# Patient Record
Sex: Male | Born: 2000 | Race: White | Hispanic: No | Marital: Single | Smoking: Never smoker
Health system: Southern US, Community
[De-identification: ages and names within clinical notes are randomized; demographics above are authoritative.]

## PROBLEM LIST (undated history)

## (undated) DIAGNOSIS — K5900 Constipation, unspecified: Secondary | ICD-10-CM

## (undated) DIAGNOSIS — L709 Acne, unspecified: Secondary | ICD-10-CM

## (undated) DIAGNOSIS — J45909 Unspecified asthma, uncomplicated: Secondary | ICD-10-CM

## (undated) DIAGNOSIS — F32A Depression, unspecified: Secondary | ICD-10-CM

## (undated) DIAGNOSIS — F329 Major depressive disorder, single episode, unspecified: Secondary | ICD-10-CM

## (undated) HISTORY — DX: Acne, unspecified: L70.9

## (undated) HISTORY — PX: TONSILLECTOMY: SUR1361

## (undated) HISTORY — PX: TYMPANOSTOMY TUBE PLACEMENT: SHX32

---
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
## 2022-03-08 ENCOUNTER — Emergency Department: Payer: Medicaid Other

## 2022-03-08 ENCOUNTER — Encounter: Payer: Self-pay | Admitting: Emergency Medicine

## 2022-03-08 ENCOUNTER — Emergency Department
Admission: EM | Admit: 2022-03-08 | Discharge: 2022-03-08 | Disposition: A | Discharge status: Home / Self Care | Payer: Medicaid Other | Attending: Emergency Medicine | Admitting: Emergency Medicine

## 2022-03-08 ENCOUNTER — Other Ambulatory Visit: Payer: Self-pay

## 2022-03-08 DIAGNOSIS — N5082 Scrotal pain: Secondary | ICD-10-CM | POA: Diagnosis present

## 2022-03-08 DIAGNOSIS — N492 Inflammatory disorders of scrotum: Secondary | ICD-10-CM | POA: Diagnosis not present

## 2022-03-08 LAB — URINALYSIS, ROUTINE W REFLEX MICROSCOPIC
Bilirubin Urine: NEGATIVE
Glucose, UA: NEGATIVE mg/dL
Hgb urine dipstick: NEGATIVE
Ketones, ur: NEGATIVE mg/dL
Leukocytes,Ua: NEGATIVE
Nitrite: NEGATIVE
Protein, ur: NEGATIVE mg/dL
Specific Gravity, Urine: 1.028 (ref 1.005–1.030)
pH: 5 (ref 5.0–8.0)

## 2022-03-08 LAB — BLOOD CULTURE ID PANEL (REFLEXED) - BCID2

## 2022-03-08 LAB — CBC WITH DIFFERENTIAL/PLATELET
Abs Immature Granulocytes: 0.02 10*3/uL (ref 0.00–0.07)
Basophils Absolute: 0 10*3/uL (ref 0.0–0.1)
Basophils Relative: 1 %
Eosinophils Absolute: 0.2 10*3/uL (ref 0.0–0.5)
Eosinophils Relative: 2 %
HCT: 43.5 % (ref 39.0–52.0)
Hemoglobin: 14.4 g/dL (ref 13.0–17.0)
Immature Granulocytes: 0 %
Lymphocytes Relative: 30 %
Lymphs Abs: 2.5 10*3/uL (ref 0.7–4.0)
MCH: 27.4 pg (ref 26.0–34.0)
MCHC: 33.1 g/dL (ref 30.0–36.0)
MCV: 82.7 fL (ref 80.0–100.0)
Monocytes Absolute: 0.6 10*3/uL (ref 0.1–1.0)
Monocytes Relative: 8 %
Neutro Abs: 5 10*3/uL (ref 1.7–7.7)
Neutrophils Relative %: 59 %
Platelets: 247 10*3/uL (ref 150–400)
RBC: 5.26 MIL/uL (ref 4.22–5.81)
RDW: 12.6 % (ref 11.5–15.5)
WBC: 8.3 10*3/uL (ref 4.0–10.5)
nRBC: 0 % (ref 0.0–0.2)

## 2022-03-08 LAB — BASIC METABOLIC PANEL
Anion gap: 11 (ref 5–15)
BUN: 17 mg/dL (ref 6–20)
CO2: 24 mmol/L (ref 22–32)
Calcium: 9.8 mg/dL (ref 8.9–10.3)
Chloride: 101 mmol/L (ref 98–111)
Creatinine, Ser: 0.96 mg/dL (ref 0.61–1.24)
GFR, Estimated: >60 mL/min (ref >60)
Glucose, Bld: 99 mg/dL (ref 70–99)
Potassium: 3.6 mmol/L (ref 3.5–5.1)
Sodium: 136 mmol/L (ref 135–145)

## 2022-03-08 LAB — CHLAMYDIA/NGC RT PCR (ARMC ONLY)
Chlamydia Tr: NOT DETECTED
N gonorrhoeae: NOT DETECTED

## 2022-03-08 LAB — LACTIC ACID, PLASMA: Lactic Acid, Venous: 0.7 mmol/L (ref 0.5–1.9)

## 2022-03-08 LAB — HIV ANTIBODY (ROUTINE TESTING W REFLEX): HIV Screen 4th Generation wRfx: NONREACTIVE

## 2022-03-08 LAB — PROCALCITONIN: Procalcitonin: <0.1 ng/mL

## 2022-03-08 MED ORDER — CEPHALEXIN 500 MG PO CAPS: 500.0000 mg | ORAL_CAPSULE | Freq: Three times a day (TID) | ORAL | 0 refills | Status: AC

## 2022-03-08 MED ORDER — LACTATED RINGERS IV BOLUS
1000.0000 mL | Freq: Once | INTRAVENOUS | Status: AC
  Administered 2022-03-08: 1000 mL via INTRAVENOUS

## 2022-03-08 MED ORDER — ACETAMINOPHEN 500 MG PO TABS
1000.0000 mg | ORAL_TABLET | Freq: Once | ORAL | Status: AC
  Administered 2022-03-08: 1000 mg via ORAL
  Filled 2022-03-08: qty 2

## 2022-03-08 MED ORDER — OXYCODONE HCL 5 MG PO TABS
5.0000 mg | ORAL_TABLET | Freq: Once | ORAL | Status: AC
  Administered 2022-03-08: 5 mg via ORAL
  Filled 2022-03-08: qty 1

## 2022-03-08 MED ORDER — OXYCODONE HCL 5 MG PO TABS: 5.0000 mg | ORAL_TABLET | Freq: Four times a day (QID) | ORAL | 0 refills | Status: DC | PRN

## 2022-03-08 MED ORDER — IOHEXOL 350 MG/ML SOLN
100.0000 mL | Freq: Once | INTRAVENOUS | Status: AC | PRN
  Administered 2022-03-08: 100 mL via INTRAVENOUS

## 2022-03-08 MED ORDER — CEPHALEXIN 500 MG PO CAPS
500.0000 mg | ORAL_CAPSULE | Freq: Once | ORAL | Status: AC
  Administered 2022-03-08: 500 mg via ORAL
  Filled 2022-03-08: qty 1

## 2022-03-08 MED ORDER — KETOROLAC TROMETHAMINE 30 MG/ML IJ SOLN
15.0000 mg | Freq: Once | INTRAMUSCULAR | Status: AC
  Administered 2022-03-08: 15 mg via INTRAVENOUS
  Filled 2022-03-08: qty 1

## 2022-03-08 NOTE — ED Notes (Signed)
Discharge instructions explained to patient and family at this time. Patient and family state they understand and agree.

## 2022-03-08 NOTE — Discharge Instructions (Signed)
Please take Tylenol and ibuprofen/Advil for your pain.  It is safe to take them together, or to alternate them every few hours.  Take up to 1032m of Tylenol at a time, up to 4 times per day.  Do not take more than 4000 mg of Tylenol in 24 hours.  For ibuprofen, take 400-600 mg, 3 - 4 times per day.  You are being discharged with a prescription for Keflex antibiotic to take 3 times daily for the next 5 days to treat cellulitis/skin infection  Oxycodone pain medicine to use as needed for more severe/breakthrough pain.  Do not use while driving or operating machinery.  Please reach out to the clinic of Dr. BErlene Quan a local urologist, to be seen in the clinic

## 2022-03-08 NOTE — ED Provider Notes (Signed)
[facility] Provider Note    Event Date/Time   First MD Initiated Contact with Patient 03/08/22 [phone]     (approximate)   History   Testicle Pain   HPI  Ryan Odom is a 22 y.o. male who presents to the ED for evaluation of Testicle Pain   Review of medical DC summary from 11/21.  Obese patient with history of PTSD, WPW.   Patient presents to the ED for evaluation of 2 to 3 days of penile and scrotal pain and swelling.  This has never happened before.  No trauma.  No sexual intercourse of any kind for 80 months, male or male.  Denies masturbation.  He reports symptoms started 2 to 3 days ago with scaling and dry skin, increasingly red and painful.  Reports systemic symptoms of feeling "unwell" earlier today.  No documented fevers.  He is uncertain about urethral discharge when he is not voiding, but he thought there was "some green stuff" at some point.  He reports his urine is "split like a snake tongue" when he pees in the past day.  Reports discomfort around the penis when he is voiding.  Also reports some left flank discomfort.  Denies stool changes or pain with defecation   Physical Exam   Triage Vital Signs: ED Triage Vitals  Enc Vitals Group     BP 03/08/22 0050 (!) 131/93     Pulse Rate 03/08/22 0050 84     Resp 03/08/22 0050 18     Temp 03/08/22 0050 98.1 F (36.7 C)     Temp Source 03/08/22 0050 Oral     SpO2 03/08/22 0050 98 %     Weight 03/08/22 0048 235 lb (106.6 kg)     Height 03/08/22 0048 5' 9"$  (1.753 m)     Head Circumference --      Peak Flow --      Pain Score 03/08/22 0047 7     Pain Loc --      Pain Edu? --      Excl. in [place]? --     Most recent vital signs: Vitals:   03/08/22 0050  BP: (!) 131/93  Pulse: 84  Resp: 18  Temp: 98.1 F (36.7 C)  SpO2: 98%    General: Awake, no distress.  Obese, ambulatory and looks systemically well. CV:  Good peripheral perfusion.  Resp:  Normal effort.  Abd:  No distention.   Soft and benign throughout.  No pubic, suprapubic or inguinal tenderness or skin changes.  No masses. GU:   Exam chaperoned by nursing staff, Liane Comber.  Circumcised penis and scrotum are diffusely erythematous without discrete dermatologic features beyond erythema/erythroderma.  No raised features.  No noted urethral discharge.  Tender throughout.  Bilateral testicles are descended, smooth, round and symmetric.  MSK:  No deformity noted.  Neuro:  No focal deficits appreciated. Other:       ED Results / Procedures / Treatments   Labs (all labs ordered are listed, but only abnormal results are displayed) Labs Reviewed  URINALYSIS, ROUTINE W REFLEX MICROSCOPIC - Abnormal; Notable for the following components:      Result Value   Color, Urine YELLOW (*)    APPearance CLEAR (*)    All other components within normal limits  CHLAMYDIA/NGC RT PCR ([facility] ONLY)            URINE CULTURE  CULTURE, BLOOD (SINGLE)  CBC WITH DIFFERENTIAL/PLATELET  BASIC METABOLIC PANEL  PROCALCITONIN  LACTIC ACID, PLASMA  LACTIC ACID, PLASMA  HIV ANTIBODY (ROUTINE TESTING W REFLEX)    EKG   RADIOLOGY CT abdomen/pelvis interpreted by me without evidence of acute intra-abdominal pathology.   Scrotal ultrasound interpreted by me without signs of abscess or clear pathology  Official radiology report(s): US SCROTUM W/DOPPLER  Result Date: 03/08/2022 CLINICAL DATA:  Scrotal pain and discoloration. EXAM: SCROTAL ULTRASOUND DOPPLER ULTRASOUND OF THE TESTICLES TECHNIQUE: Complete ultrasound examination of the testicles, epididymis, and other scrotal structures was performed. Color and spectral Doppler ultrasound were also utilized to evaluate blood flow to the testicles. COMPARISON:  Testicular ultrasound dated 05/22/2020. FINDINGS: Right testicle Measurements: 4.4 x 1.8 x 1.8 cm. No mass or microlithiasis visualized. Left testicle Measurements: 4.3 x 1.9 x 3.4 cm. No mass or microlithiasis visualized. Right  epididymis:  Normal in size and appearance. Left epididymis:  Normal in size and appearance. Hydrocele:  None visualized. Varicocele:  None visualized. Pulsed Doppler interrogation of both testes demonstrates normal low resistance arterial and venous waveforms bilaterally. IMPRESSION: Unremarkable testicular ultrasound. Electronically Signed   By: Anner Crete M.D.   On: 03/08/2022 03:20   CT ABDOMEN PELVIS W CONTRAST  Result Date: 03/08/2022 CLINICAL DATA:  Pelvic cellulitis EXAM: CT ABDOMEN AND PELVIS WITH CONTRAST TECHNIQUE: Multidetector CT imaging of the abdomen and pelvis was performed using the standard protocol following bolus administration of intravenous contrast. RADIATION DOSE REDUCTION: This exam was performed according to the departmental dose-optimization program which includes automated exposure control, adjustment of the mA and/or kV according to patient size and/or use of iterative reconstruction technique. CONTRAST:  146m OMNIPAQUE IOHEXOL 350 MG/ML SOLN COMPARISON:  12/06/2021 FINDINGS: Lower chest: Lung bases are clear. Hepatobiliary: No focal liver abnormality is seen. No gallstones, gallbladder wall thickening, or biliary dilatation. Pancreas: Normal Spleen: Normal Adrenals/Urinary Tract: Adrenal glands are normal. Both kidneys are normal. There is no hydroureteronephrosis. Urinary bladder is normal. Stomach/Bowel: Stomach is within normal limits. Appendix appears normal. No evidence of bowel wall thickening, distention, or inflammatory changes. Vascular/Lymphatic: No significant vascular findings are present. No enlarged abdominal or pelvic lymph nodes. Reproductive: The prostate is normal. There is limited visualization of the penis and scrotum. No abnormality within the visualized pelvic soft tissues. Other: None Musculoskeletal: No acute or significant osseous findings. IMPRESSION: 1. No acute abnormality of the abdomen or pelvis. 2. Limited visualization of the penis and scrotum.  No abnormality within the visualized pelvic soft tissues. Electronically Signed   By: KUlyses JarredM.D.   On: 03/08/2022 02:52    PROCEDURES and INTERVENTIONS:  Procedures  Medications  cephALEXin (KEFLEX) capsule 500 mg (has no administration in time range)  acetaminophen (TYLENOL) tablet 1,000 mg (has no administration in time range)  oxyCODONE (Oxy IR/ROXICODONE) immediate release tablet 5 mg (has no administration in time range)  ketorolac (TORADOL) 30 MG/ML injection 15 mg (15 mg Intravenous Given 03/08/22 0137)  lactated ringers bolus 1,000 mL (0 mLs Intravenous Stopped 03/08/22 0313)  iohexol (OMNIPAQUE) 350 MG/ML injection 100 mL (100 mLs Intravenous Contrast Given 03/08/22 0214)     IMPRESSION / MDM / ASSESSMENT AND PLAN / ED COURSE  I reviewed the triage vital signs and the nursing notes.  Differential diagnosis includes, but is not limited to, cellulitis, sepsis, abscess, gonorrhea, chlamydia, pyelonephritis,   {Patient presents with symptoms of an acute illness or injury that is potentially life-threatening.  22year old presents to the ED with testicular and scrotal pain, possibly due to scrotal cellulitis and suitable for outpatient management with  close urology follow-up.  Normal vital signs without fever or signs of sepsis.  Similarly reassuring blood work with normal CBC, metabolic panel, lactic acid, procalcitonin.  Urine without signs of UTI or gonorrhea/chlamydia.  Normal scrotal ultrasound and CT.  On exam, he has a diffusely erythematous and tender penis and scrotum.  Is consistent with cellulitis, doubt Fournier's gangrene.  His pain is controlled and he was discharged with Keflex to follow-up with urology  Clinical Course as of 03/08/22 0422  Tue Mar 08, 2022  0356 Reassessed and discussed workup so far.  Got some additional history that was not particularly helpful.  We discussed likely cellulitis but some degree of uncertainty.  We discussed following up with  urology closely as we start a course of antibiotics to treat as such. [DS]    Clinical Course User Index [DS] Vladimir Crofts, MD     FINAL CLINICAL IMPRESSION(S) / ED DIAGNOSES   Final diagnoses:  Cellulitis of scrotum  Scrotal pain     Rx / DC Orders   ED Discharge Orders          Ordered    cephALEXin (KEFLEX) 500 MG capsule  3 times daily        03/08/22 0358    oxyCODONE (ROXICODONE) 5 MG immediate release tablet  Every 6 hours PRN        03/08/22 0358             Note:  This document was prepared using Dragon voice recognition software and may include unintentional dictation errors.   Vladimir Crofts, MD 03/08/22 [phone]

## 2022-03-08 NOTE — ED Triage Notes (Signed)
Patient ambulatory to triage with steady gait, without difficulty or distress noted; pt reports last few days having pain & discoloration to penis & scrotum with no known injury

## 2022-03-09 LAB — URINE CULTURE: Culture: <10000 — AB

## 2022-03-10 LAB — CULTURE, BLOOD (SINGLE): Special Requests: ADEQUATE

## 2022-03-22 ENCOUNTER — Encounter (INDEPENDENT_AMBULATORY_CARE_PROVIDER_SITE_OTHER): Payer: Self-pay

## 2022-05-09 ENCOUNTER — Other Ambulatory Visit: Payer: Self-pay

## 2022-05-09 ENCOUNTER — Emergency Department
Admission: EM | Admit: 2022-05-09 | Discharge: 2022-05-09 | Disposition: A | Discharge status: Home / Self Care | Payer: Medicaid Other | Attending: Emergency Medicine | Admitting: Emergency Medicine

## 2022-05-09 DIAGNOSIS — W891XXA Exposure to tanning bed, initial encounter: Secondary | ICD-10-CM | POA: Diagnosis not present

## 2022-05-09 DIAGNOSIS — L568 Other specified acute skin changes due to ultraviolet radiation: Secondary | ICD-10-CM | POA: Diagnosis present

## 2022-05-09 MED ORDER — OXYCODONE HCL 5 MG PO TABS
5.0000 mg | ORAL_TABLET | Freq: Once | ORAL | Status: AC
  Administered 2022-05-09: 5 mg via ORAL
  Filled 2022-05-09: qty 1

## 2022-05-09 MED ORDER — KETOROLAC TROMETHAMINE 30 MG/ML IJ SOLN
30.0000 mg | Freq: Once | INTRAMUSCULAR | Status: AC
  Administered 2022-05-09: 30 mg via INTRAMUSCULAR
  Filled 2022-05-09: qty 1

## 2022-05-09 NOTE — Discharge Instructions (Signed)
Please take Tylenol and ibuprofen/Advil for your pain.  It is safe to take them together, or to alternate them every few hours.  Take up to 1000mg of Tylenol at a time, up to 4 times per day.  Do not take more than 4000 mg of Tylenol in 24 hours.  For ibuprofen, take 400-600 mg, 3 - 4 times per day.  

## 2022-05-09 NOTE — ED Triage Notes (Signed)
Pt sts he was in a tanning bed and sts his normal timer is 7 mins and the employees put the timer for 15 and there was no way for him to contact them that it was too long. Pt has redness all over his torso and his back. No obvious blisters noted. Sts this happened 3 days ago.

## 2022-05-09 NOTE — ED Provider Notes (Signed)
   [facility] Provider Note    Event Date/Time   First MD Initiated Contact with Patient 05/09/22 0234     (approximate)   History   Sunburn   HPI  MICHAL CALLICOTT is a 22 y.o. male who presents to the ED for evaluation of Sunburn   Patient presents to the ED with a sunburn.  He reports going to a tanning booth 3 days ago and is at the time for too long and he had a burn since then.  Reports difficulty sleeping.  He is asking if we can fix it or give him some cream to make it go away   Physical Exam   Triage Vital Signs: ED Triage Vitals  Enc Vitals Group     BP 05/09/22 0144 132/74     Pulse Rate 05/09/22 0144 72     Resp 05/09/22 0144 19     Temp 05/09/22 0144 98.5 F (36.9 C)     Temp Source 05/09/22 0144 Oral     SpO2 05/09/22 0144 98 %     Weight --      Height --      Head Circumference --      Peak Flow --      Pain Score 05/09/22 0155 9     Pain Loc --      Pain Edu? --      Excl. in GC? --     Most recent vital signs: Vitals:   05/09/22 0144  BP: 132/74  Pulse: 72  Resp: 19  Temp: 98.5 F (36.9 C)  SpO2: 98%    General: Awake, no distress.  Moist mucous membranes CV:  Good peripheral perfusion.  Resp:  Normal effort.  Abd:  No distention.  MSK:  No deformity noted.  Neuro:  No focal deficits appreciated. Other:  Superficial burn to the trunk without blistering or any evidence of partial-thickness or deeper burns.   ED Results / Procedures / Treatments   Labs (all labs ordered are listed, but only abnormal results are displayed) Labs Reviewed - No data to display  EKG   RADIOLOGY   Official radiology report(s): No results found.  PROCEDURES and INTERVENTIONS:  Procedures  Medications  ketorolac (TORADOL) 30 MG/ML injection 30 mg (30 mg Intramuscular Given 05/09/22 0242)  oxyCODONE (Oxy IR/ROXICODONE) immediate release tablet 5 mg (5 mg Oral Given 05/09/22 0243)     IMPRESSION / MDM / ASSESSMENT AND PLAN  / ED COURSE  I reviewed the triage vital signs and the nursing notes.  Differential diagnosis includes, but is not limited to, superficial burn, partial-thickness burn, cellulitis  Patient presents with superficial burn from a tanning booth suitable for outpatient management.  We discussed symptomatic measures and I see no indications for antibiotics, doubt cellulitis      FINAL CLINICAL IMPRESSION(S) / ED DIAGNOSES   Final diagnoses:  Sunburn due to tanning bed radiation     Rx / DC Orders   ED Discharge Orders     None        Note:  This document was prepared using Dragon voice recognition software and may include unintentional dictation errors.   Delton Prairie, MD 05/09/22 [phone]

## 2022-08-15 IMAGING — CT CT ANGIO CHEST
2 of 7 series · 18 of 46 positions shown · IV contrast (APPLIED)
Comparison: 02/03/2021 chest x-ray

CLINICAL DATA: Recently COVID positive last week, shortness of
breath with some chest pain

EXAM:
CT ANGIOGRAPHY CHEST WITH CONTRAST
TECHNIQUE: Multidetector CT imaging of the chest was performed using the
standard protocol during bolus administration of intravenous
contrast. Multiplanar CT image reconstructions and MIPs were
obtained to evaluate the vascular anatomy.

[Series 5: thins · axial · 0.67mm/px · z∈[-242,+20]mm · 15 of 365 slices shown]
[im 19/365  lung]
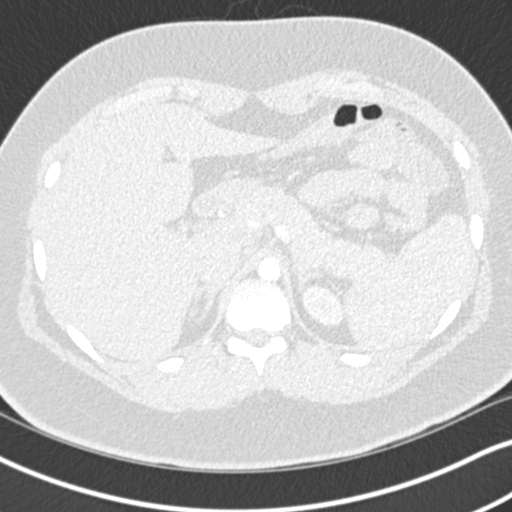
[im 37/365  soft-tissue]
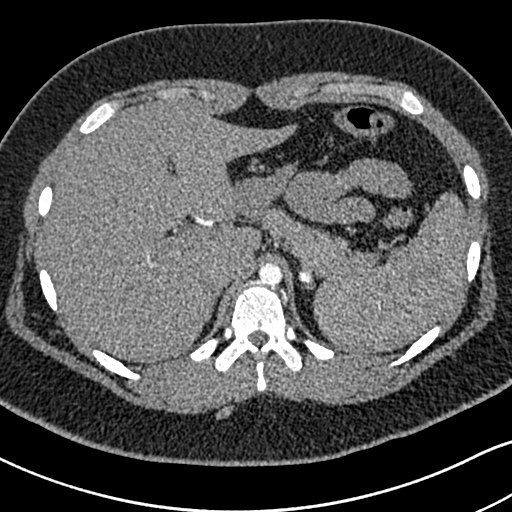
[im 73/365  lung]
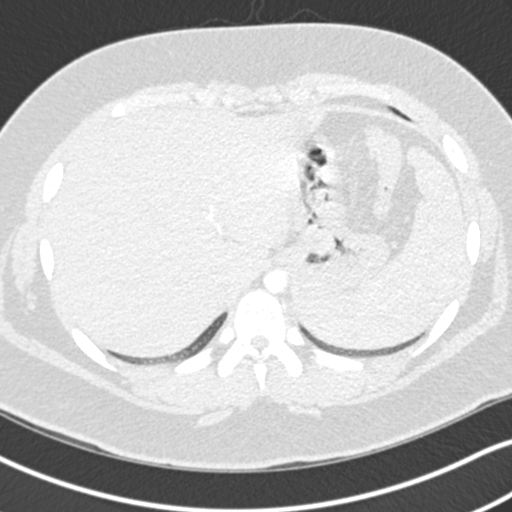
[im 92/365  soft-tissue]
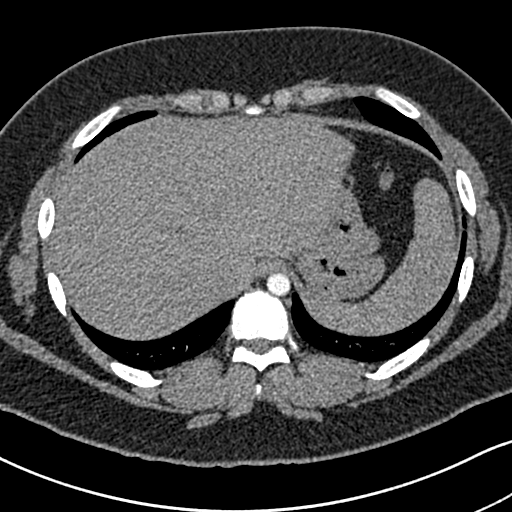
[im 110/365  lung]
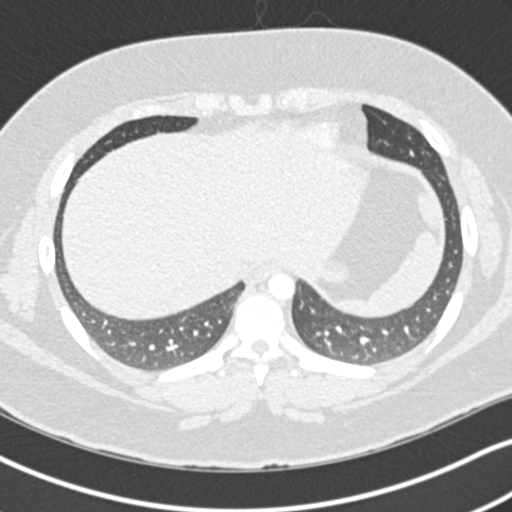
[im 128/365  soft-tissue]
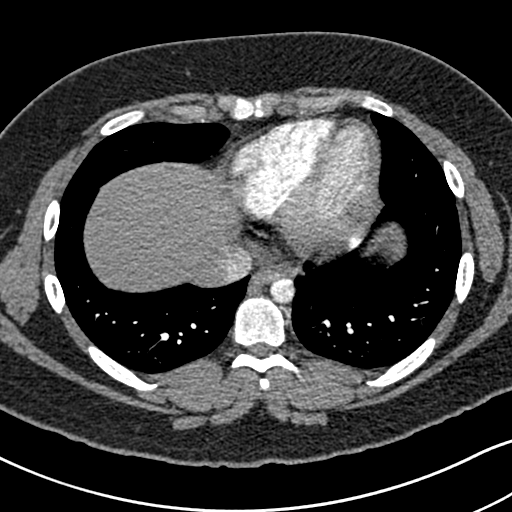
[im 164/365  lung]
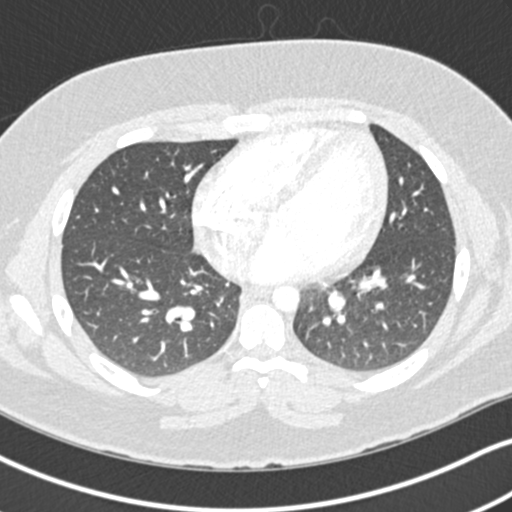
[im 183/365  soft-tissue]
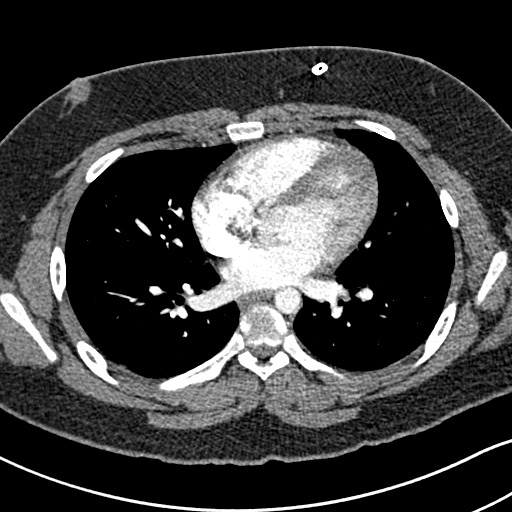
[im 201/365  lung]
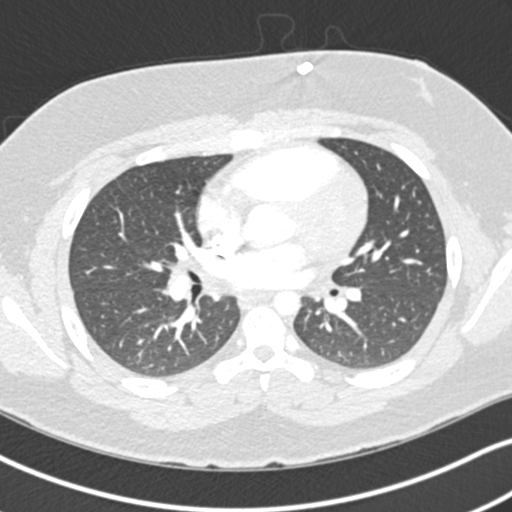
[im 237/365  soft-tissue]
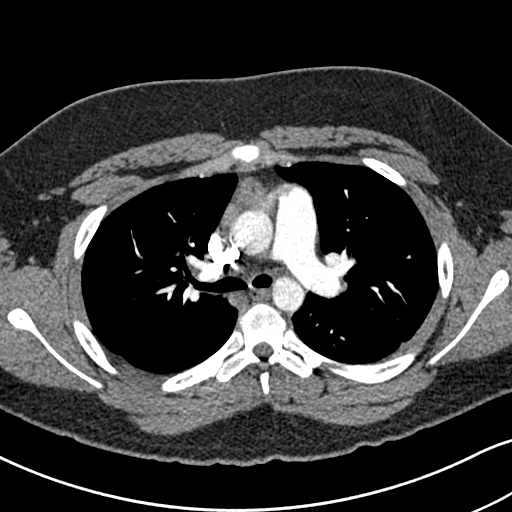
[im 255/365  lung]
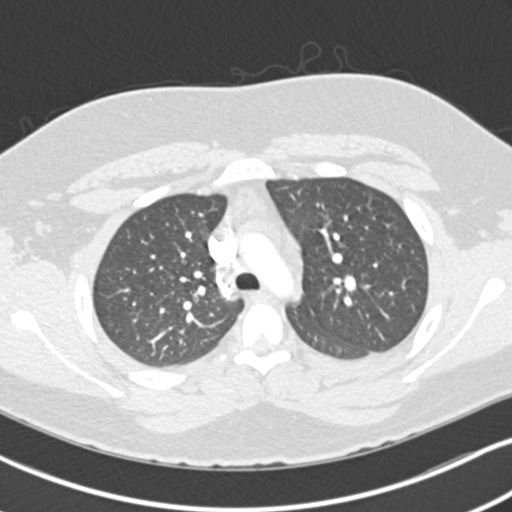
[im 274/365  soft-tissue]
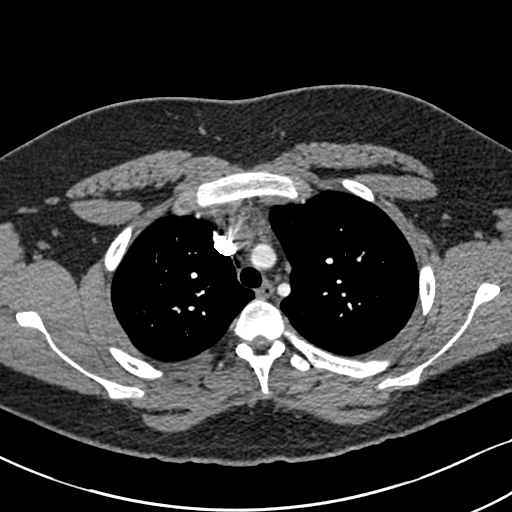
[im 292/365  lung]
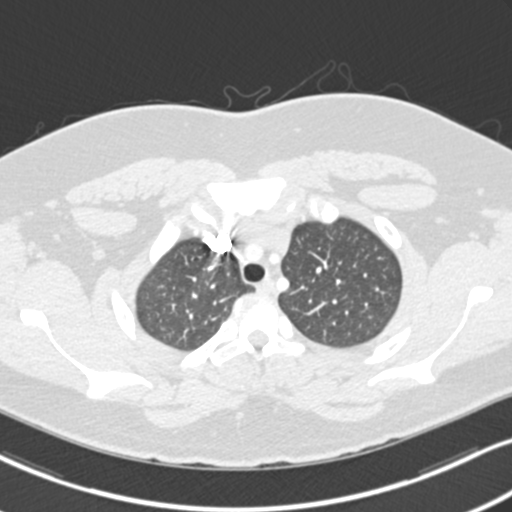
[im 328/365  soft-tissue]
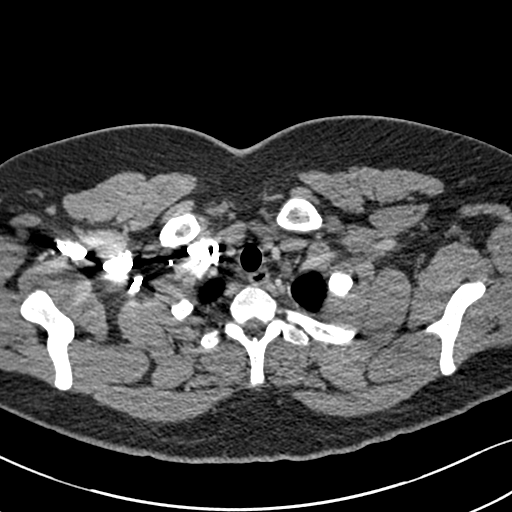
[im 346/365  lung]
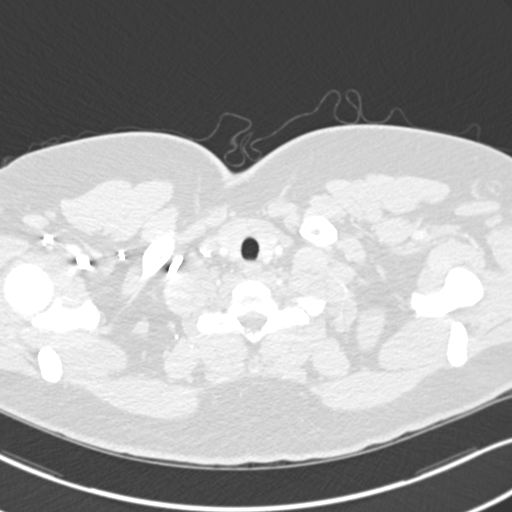

[Series 7: coronal mpr · coronal · 0.57mm/px · 3 of 82 slices shown]
[im 21/82  soft-tissue]
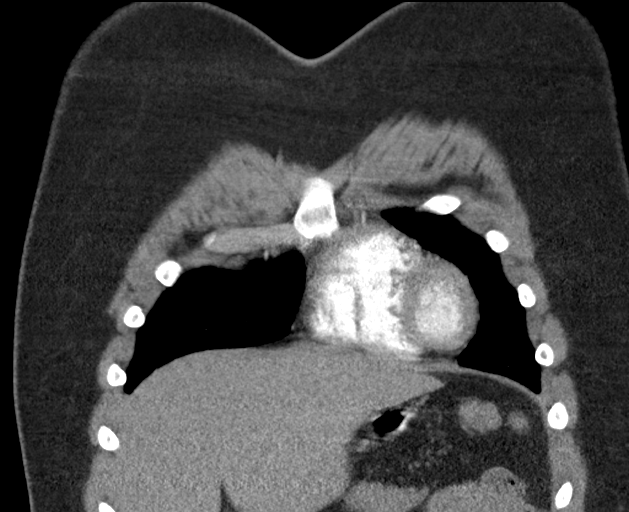
[im 41/82  soft-tissue]
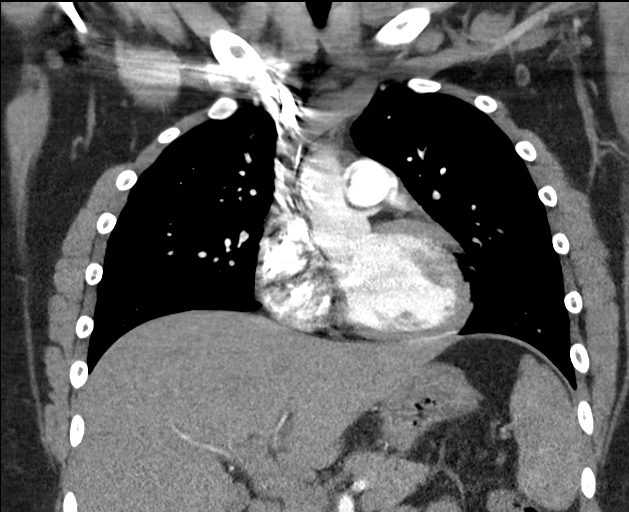
[im 61/82  soft-tissue]
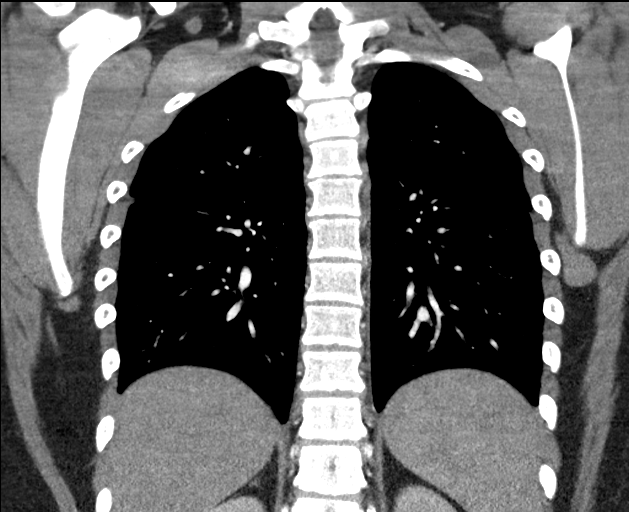

[18 of 46 positions shown; findings below may reference images not displayed]

RADIATION DOSE REDUCTION: This exam was performed according to the
departmental dose-optimization program which includes automated
exposure control, adjustment of the mA and/or kV according to
patient size and/or use of iterative reconstruction technique.

CONTRAST:  75mL OMNIPAQUE IOHEXOL 350 MG/ML SOLN
FINDINGS: Cardiovascular: Pulmonary arteries are normal in caliber and appear
patent. Negative for significant acute filling defect or pulmonary
embolus by CTA.

Intact thoracic aorta. Patent bovine arch anatomy. No mediastinal
hemorrhage or hematoma. No aneurysm or dissection. Normal heart
size. No pericardial effusion. Central venous structures are patent.
No Akugbe process.

Mediastinum/Nodes: Thyroid unremarkable. Residual thymus in the
anterior mediastinum. Trachea and central airways are patent. No
adenopathy. Esophagus nondilated. No hiatal hernia.

Lungs/Pleura: Lungs are clear. No pleural effusion or pneumothorax.

Upper Abdomen: No acute abnormality.

Musculoskeletal: No chest wall abnormality. No acute or significant
osseous findings. Cardiac loop recorder device noted in the anterior
chest subcutaneous tissues.

Review of the MIP images confirms the above findings.
IMPRESSION: 1. Negative for significant acute pulmonary embolus by CTA.
2. Negative for acute intrathoracic finding.

## 2022-10-31 ENCOUNTER — Encounter: Payer: Self-pay | Admitting: *Deleted

## 2022-10-31 ENCOUNTER — Emergency Department
Admission: EM | Admit: 2022-10-31 | Discharge: 2022-10-31 | Discharge status: Against Medical Advice | Payer: MEDICAID | Attending: Emergency Medicine | Admitting: Emergency Medicine

## 2022-10-31 ENCOUNTER — Other Ambulatory Visit: Payer: Self-pay

## 2022-10-31 ENCOUNTER — Emergency Department: Payer: MEDICAID

## 2022-10-31 DIAGNOSIS — Z5321 Procedure and treatment not carried out due to patient leaving prior to being seen by health care provider: Secondary | ICD-10-CM | POA: Insufficient documentation

## 2022-10-31 DIAGNOSIS — R1012 Left upper quadrant pain: Secondary | ICD-10-CM | POA: Diagnosis not present

## 2022-10-31 DIAGNOSIS — Y9301 Activity, walking, marching and hiking: Secondary | ICD-10-CM | POA: Diagnosis not present

## 2022-10-31 DIAGNOSIS — S0081XA Abrasion of other part of head, initial encounter: Secondary | ICD-10-CM | POA: Insufficient documentation

## 2022-10-31 DIAGNOSIS — H538 Other visual disturbances: Secondary | ICD-10-CM | POA: Diagnosis not present

## 2022-10-31 DIAGNOSIS — W0110XA Fall on same level from slipping, tripping and stumbling with subsequent striking against unspecified object, initial encounter: Secondary | ICD-10-CM | POA: Insufficient documentation

## 2022-10-31 LAB — LIPASE, BLOOD: Lipase: 39 U/L (ref 11–51)

## 2022-10-31 LAB — URINALYSIS, ROUTINE W REFLEX MICROSCOPIC
Bilirubin Urine: NEGATIVE
Glucose, UA: NEGATIVE mg/dL
Hgb urine dipstick: NEGATIVE
Ketones, ur: NEGATIVE mg/dL
Leukocytes,Ua: NEGATIVE
Nitrite: NEGATIVE
Protein, ur: NEGATIVE mg/dL
Specific Gravity, Urine: 1.027 (ref 1.005–1.030)
pH: 5 (ref 5.0–8.0)

## 2022-10-31 LAB — COMPREHENSIVE METABOLIC PANEL
ALT: 24 U/L (ref 0–44)
AST: 23 U/L (ref 15–41)
Albumin: 4 g/dL (ref 3.5–5.0)
Alkaline Phosphatase: 79 U/L (ref 38–126)
Anion gap: 12 (ref 5–15)
BUN: 16 mg/dL (ref 6–20)
CO2: 23 mmol/L (ref 22–32)
Calcium: 8.9 mg/dL (ref 8.9–10.3)
Chloride: 104 mmol/L (ref 98–111)
Creatinine, Ser: 1.07 mg/dL (ref 0.61–1.24)
GFR, Estimated: >60 mL/min (ref >60)
Glucose, Bld: 90 mg/dL (ref 70–99)
Potassium: 3.7 mmol/L (ref 3.5–5.1)
Sodium: 139 mmol/L (ref 135–145)
Total Bilirubin: 0.8 mg/dL (ref 0.3–1.2)
Total Protein: 6.3 g/dL — ABNORMAL LOW (ref 6.5–8.1)

## 2022-10-31 LAB — CBC
HCT: 47.9 % (ref 39.0–52.0)
Hemoglobin: 16 g/dL (ref 13.0–17.0)
MCH: 28.8 pg (ref 26.0–34.0)
MCHC: 33.4 g/dL (ref 30.0–36.0)
MCV: 86.3 fL (ref 80.0–100.0)
Platelets: 224 10*3/uL (ref 150–400)
RBC: 5.55 MIL/uL (ref 4.22–5.81)
RDW: 12.4 % (ref 11.5–15.5)
WBC: 9.8 10*3/uL (ref 4.0–10.5)
nRBC: 0 % (ref 0.0–0.2)

## 2022-10-31 NOTE — ED Notes (Signed)
Pt ambulated w/ steady gait towards ED exit.

## 2022-10-31 NOTE — ED Triage Notes (Addendum)
Pt to triage via wheelchair.  Pt brought in via ems from work.  Pt reports left upper abd pain for 1 month.  Pt has abrasion to left forehead from a fall this am. Pt reports loc.  Pt also reports visual changes today.  Denies neck or back pain.    Pt alert.  Speech clear.

## 2022-10-31 NOTE — ED Triage Notes (Addendum)
EMS brings pt in for c/o abd pain; st was walking to work yesterday and fell hitting head, +LOC; seen at [facility] and dx with "enlarged spleen"; no records of such found in care everywhere; pt now st that he went to an urgent care yesterday and had labs only with no official diagnosis

## 2022-10-31 NOTE — ED Notes (Signed)
Pt to nurses station asking when provider will see him again. Informed pt he just came back from Cat scan and once results are back the provider will re-evaluate and inform pt of results. Pt states " I just want a work note, I don't want to be here all night".

## 2022-10-31 NOTE — ED Notes (Signed)
Pt unable to void at this time.

## 2022-10-31 NOTE — ED Notes (Addendum)
Patient transported to CT 

## 2022-12-16 ENCOUNTER — Encounter: Payer: Self-pay | Admitting: Emergency Medicine

## 2022-12-16 ENCOUNTER — Emergency Department: Payer: MEDICAID

## 2022-12-16 ENCOUNTER — Other Ambulatory Visit: Payer: Self-pay

## 2022-12-16 ENCOUNTER — Emergency Department
Admission: EM | Admit: 2022-12-16 | Discharge: 2022-12-16 | Disposition: A | Discharge status: Home / Self Care | Payer: MEDICAID | Attending: Emergency Medicine | Admitting: Emergency Medicine

## 2022-12-16 DIAGNOSIS — R531 Weakness: Secondary | ICD-10-CM | POA: Insufficient documentation

## 2022-12-16 DIAGNOSIS — J45909 Unspecified asthma, uncomplicated: Secondary | ICD-10-CM | POA: Insufficient documentation

## 2022-12-16 DIAGNOSIS — R55 Syncope and collapse: Secondary | ICD-10-CM | POA: Insufficient documentation

## 2022-12-16 DIAGNOSIS — R29898 Other symptoms and signs involving the musculoskeletal system: Secondary | ICD-10-CM | POA: Diagnosis not present

## 2022-12-16 LAB — DIFFERENTIAL
Abs Immature Granulocytes: 0.03 10*3/uL (ref 0.00–0.07)
Basophils Absolute: 0 10*3/uL (ref 0.0–0.1)
Basophils Relative: 1 %
Eosinophils Absolute: 0.1 10*3/uL (ref 0.0–0.5)
Eosinophils Relative: 1 %
Immature Granulocytes: 0 %
Lymphocytes Relative: 20 %
Lymphs Abs: 1.4 10*3/uL (ref 0.7–4.0)
Monocytes Absolute: 0.3 10*3/uL (ref 0.1–1.0)
Monocytes Relative: 5 %
Neutro Abs: 5.1 10*3/uL (ref 1.7–7.7)
Neutrophils Relative %: 73 %

## 2022-12-16 LAB — COMPREHENSIVE METABOLIC PANEL
ALT: 19 U/L (ref 0–44)
AST: 19 U/L (ref 15–41)
Albumin: 3.7 g/dL (ref 3.5–5.0)
Alkaline Phosphatase: 78 U/L (ref 38–126)
Anion gap: 8 (ref 5–15)
BUN: 17 mg/dL (ref 6–20)
CO2: 25 mmol/L (ref 22–32)
Calcium: 8.9 mg/dL (ref 8.9–10.3)
Chloride: 104 mmol/L (ref 98–111)
Creatinine, Ser: 1.14 mg/dL (ref 0.61–1.24)
GFR, Estimated: >60 mL/min (ref >60)
Glucose, Bld: 101 mg/dL — ABNORMAL HIGH (ref 70–99)
Potassium: 4.2 mmol/L (ref 3.5–5.1)
Sodium: 137 mmol/L (ref 135–145)
Total Bilirubin: 0.7 mg/dL (ref <1.2)
Total Protein: 5.9 g/dL — ABNORMAL LOW (ref 6.5–8.1)

## 2022-12-16 LAB — CBC
HCT: 50 % (ref 39.0–52.0)
Hemoglobin: 16.9 g/dL (ref 13.0–17.0)
MCH: 29.2 pg (ref 26.0–34.0)
MCHC: 33.8 g/dL (ref 30.0–36.0)
MCV: 86.4 fL (ref 80.0–100.0)
Platelets: 193 10*3/uL (ref 150–400)
RBC: 5.79 MIL/uL (ref 4.22–5.81)
RDW: 12.4 % (ref 11.5–15.5)
WBC: 7 10*3/uL (ref 4.0–10.5)
nRBC: 0 % (ref 0.0–0.2)

## 2022-12-16 LAB — PROTIME-INR
INR: 1.1 (ref 0.8–1.2)
Prothrombin Time: 14.4 s (ref 11.4–15.2)

## 2022-12-16 LAB — CBG MONITORING, ED
Glucose-Capillary: 65 mg/dL — ABNORMAL LOW (ref 70–99)
Glucose-Capillary: 59 mg/dL — ABNORMAL LOW (ref 70–99)

## 2022-12-16 LAB — APTT: aPTT: 24 s (ref 24–36)

## 2022-12-16 LAB — ETHANOL: Alcohol, Ethyl (B): <10 mg/dL (ref <10)

## 2022-12-16 MED ORDER — SODIUM CHLORIDE 0.9 % IV BOLUS
1000.0000 mL | Freq: Once | INTRAVENOUS | Status: AC
  Administered 2022-12-16: 1000 mL via INTRAVENOUS

## 2022-12-16 MED ORDER — IOHEXOL 350 MG/ML SOLN
75.0000 mL | Freq: Once | INTRAVENOUS | Status: AC | PRN
  Administered 2022-12-16: 75 mL via INTRAVENOUS

## 2022-12-16 NOTE — ED Triage Notes (Signed)
Pt here after giving plasma today and blacking out at the bus stop. Pt states he thinks his bp dropped. Pt states now he feels much better. Pt denies any issues.

## 2022-12-16 NOTE — Consult Note (Addendum)
NEURO HOSPITALIST CONSULT NOTE   Requestig physician: Dr. Modesto Charon  Reason for Consult: Acute onset of left sided weakness  History obtained from:  Patient and Chart     HPI:                                                                                                                                          Ryan Odom is an 22 y.o. male with a PMHx of WPW syndrome s/p ablation, acne, asthma, constipation, depression and dizziness who presents to the ED after blacking out at a bus stop following a plasma donation. The patient had to run to the bus stop to avoid missing the bus. When he arriived at the bus stop he felt lightheaded and then blacked out. He came to again, but then had multiple additional syncopal episodes. He thinks that his BP dropped at that time. After arriving to the ED, he had a syncopal episode on the way to a treatment room. He stated that he could not see, began to get diaphoretic and started to slide out of his wheelchair. He was assisted back into the wheelchair and brought to the treatment room. He continued to syncopize intermittently and was noted to appear unwell as well as diaphoretic. The patient on EDP assessment endorsed LUE and LLE numbness. His LLE was weak on exam by EDP. Code Stroke was then called. LKN 1245.   He donates plasma 2x per week and has done so for many months. .   Past Medical History:  Diagnosis Date   Acne    Asthma    Constipation    Depression    Dizziness 12/06/2021    Past Surgical History:  Procedure Laterality Date   TONSILLECTOMY     TYMPANOSTOMY TUBE PLACEMENT      Family History  Problem Relation Age of Onset   Hypertension Mother    Asthma Sister             Social History:  reports that he has never smoked. He has never used smokeless tobacco. He reports that he does not drink alcohol and does not use drugs.  Allergies  Allergen Reactions   Coconut (Cocos Nucifera) Anaphylaxis   Midazolam      agitation    MEDICATIONS:  No current facility-administered medications on file prior to encounter.   Current Outpatient Medications on File Prior to Encounter  Medication Sig Dispense Refill   acetaminophen (TYLENOL) 500 MG tablet Take 2 tablets (1,000 mg total) by mouth every 8 (eight) hours. 30 tablet 0   phenol (CHLORASEPTIC) 1.4 % LIQD Use as directed 1 spray in the mouth or throat as needed for throat irritation / pain.  0    ROS:                                                                                                                                       No chest pain or palpitations. No drug or alcohol use. No recent illnesses. Left big toe is numb with paresthesias, right hand is numb with paresthesias. Has a sensation of numbness to the back of his neck.    Blood pressure 92/68, pulse (!) 109, temperature 97.9 F (36.6 C), temperature source Oral, resp. rate 17, height 5\' 10"  (1.778 m), weight 98.9 kg, SpO2 98%.   General Examination:                                                                                                       Physical Exam HEENT- [place]/AT. Neck is supple.    Lungs- Respirations unlabored Extremities- Warm and well-perfused  Neurological Examination Mental Status: Anxious appearing with a dazed expression and poor eye contact. Speech is fluent with intact comprehension. Awake, alert and oriented. Able demonstrate comprehension of all commands without difficulty. No dysarthria.  Cranial Nerves: II: Visual fields with tunnel vision upon testing of all 4 quadrants of each eye individually. Positive for extinction in the temporal field of left eye to DSS. PERRL. III,IV, VI: No ptosis. EOMI. No nystagmus but with slightly saccadic EOM. V: Temp sensation decreased on the left VII: Smile symmetric but with poor effort VIII: Hearing  intact to voice IX,X: No hypophonia or hoarseness XI: Symmetric XII: Midline tongue extension but with poor effort, not extending fully Motor: RUE: 4+/5 maximum strength against resistance proximally and distally. No drift LUE: 4-/5 maximum strength against resistance proximally and distally with drift, hitting bed before 10 seconds. There is giveway with poor effort responding to coaching.  RLE: 4+/5 proximally and distally with poor effort responding to coaching.  LLE: 4-/5. There is giveway with poor effort responding to coaching. Drifts without hitting bed before 5 seconds.  Sensory: Decreased FT and temp sensation to LLE. Decreased FT sensation to LUE with  subjectively normal temp sensation. No extinction to DSS. Deep Tendon Reflexes: 2+ and symmetric bilateral biceps, brachioradialis, triceps and patellae. 1+ bilateral achilles. Toes mute. Cerebellar: No ataxia with FNF bilaterally Gait: Deferred  NIHSS: 5    Lab Results: Basic Metabolic Panel: No results for input(s): "NA", "K", "CL", "CO2", "GLUCOSE", "BUN", "CREATININE", "CALCIUM", "MG", "PHOS" in the last 168 hours.  CBC: No results for input(s): "WBC", "NEUTROABS", "HGB", "HCT", "MCV", "PLT" in the last 168 hours.  Cardiac Enzymes: No results for input(s): "CKTOTAL", "CKMB", "CKMBINDEX", "TROPONINI" in the last 168 hours.  Lipid Panel: No results for input(s): "CHOL", "TRIG", "HDL", "CHOLHDL", "VLDL", "LDLCALC" in the last 168 hours.  Imaging: No results found.   Assessment: 22 year old male with acute onset of left sided weakness following multiple syncopal episodes after plasma donation today - Exam reveals findings suggestive of a possible right MCA lesion, but also some inconsistencies and giveway weakness with responsiveness to coaching suggesting possible functional/inorganic etiology. Due to high likelihood of a functional etiology, risks of TNK significantly outweigh potential benefits.  - CT head: No evidence  of an acute intracranial abnormality. ASPECTS is 10  - CTA of head and neck: Negative  - DDx includes acute right MCA watershed infarction due to possibly severe transient hypotension versus psychogenic pseudostroke. If a stroke has occurred, the likelihood of an thrombotic etiology is low given lack of significant risk factors. However, it should be noted that per literature search, strokes after plasma donation have been documented.    Recommendations: - STAT MRI brain - UDS and EtOH level are pending - IVF - Frequent neuro checks - Frequent BP checks - The patient should be advised that per the literature, there is an increased risk for stroke in plasma donors  Addendum: MRI brain is normal     Electronically signed: Dr. Caryl Pina 12/16/2022, 1:53 PM

## 2022-12-16 NOTE — ED Notes (Signed)
Pt had a syncopal episode on the way back to a tx room. Pt stated that he could not see and then began to get diaphoretic and began to slide out of the wheelchair. Pt assisted back into wheelchair and then taken to room 40.

## 2022-12-16 NOTE — Discharge Instructions (Signed)
Fortunately your evaluation in the emergency department did not show any emergency conditions like a stroke to explain your symptoms.  Your frequent plasma donations may have resulted in your episode today.  I would recommend that you discontinue giving plasma until you are able to meet with your primary doctor to discuss safety of these donations and a safe frequency of donation.  Make sure you stay well-hydrated by drinking plenty of fluids, eat meals, to make sure that your electrolytes and blood sugar levels remain within normal level.  Thank you for choosing [place] for your health care today!  Please see your primary doctor this week for a follow up appointment.   If you have any new, worsening, or unexpected symptoms call your doctor right away or come back to the emergency department for reevaluation.  It was my pleasure to care for you today.   Daneil Dan Modesto Charon, MD

## 2022-12-16 NOTE — ED Notes (Signed)
Initiated code stroke to [facility], tequila,  1344

## 2022-12-16 NOTE — Progress Notes (Signed)
   12/16/22 1330  Spiritual Encounters  Type of Visit Initial;Attempt (pt unavailable)  Care provided to: Patient  Referral source Code page  Reason for visit Code  OnCall Visit Yes  Advance Directives (For Healthcare)  Does Patient Have a Medical Advance Directive? No  Would patient like information on creating a medical advance directive? No - Patient declined  Mental Health Advance Directives  Does Patient Have a Mental Health Advance Directive? No  Would patient like information on creating a mental health advance directive? No - Patient declined   Chaplain received a code STROKE. Chaplain went and spoke with receptionist who told me there were no family present and that the patient was receiving a cat scan. Chaplain services remain available for patient and family.

## 2022-12-16 NOTE — ED Notes (Signed)
Orange juice provided for pt due to low CBG

## 2022-12-16 NOTE — ED Provider Notes (Addendum)
[facility] Provider Note    Event Date/Time   First MD Initiated Contact with Patient 12/16/22 1334     (approximate)   History   Near Syncope   HPI  Ryan Odom is a 22 y.o. male   Past medical history of WPW status post ablation, depression, asthma, here with multiple syncopal episodes after donating plasma today.  He donates plasma twice weekly and has for many months.  He was in his regular state of health going into his plasma donation today, completed the donation, ran to the bus stop to try to catch the bus when he became lightheaded and passed out multiple times.  No significant trauma sustained.  Then around 12:45 PM he noted left-sided arm and leg numbness and weakness in the left leg.  He denies any recent illnesses.  No chest pain or palpitations.  No drug or alcohol use.       Physical Exam   Triage Vital Signs: ED Triage Vitals  Encounter Vitals Group     BP 12/16/22 1330 92/68     Systolic BP Percentile --      Diastolic BP Percentile --      Pulse Rate 12/16/22 1329 (!) 109     Resp 12/16/22 1329 17     Temp 12/16/22 1329 97.9 F (36.6 C)     Temp Source 12/16/22 1329 Oral     SpO2 12/16/22 1329 98 %     Weight 12/16/22 1329 218 lb 0.6 oz (98.9 kg)     Height 12/16/22 1329 5\' 10"  (1.778 m)     Head Circumference --      Peak Flow --      Pain Score 12/16/22 1329 0     Pain Loc --      Pain Education --      Exclude from Growth Chart --     Most recent vital signs: Vitals:   12/16/22 1329 12/16/22 1330  BP:  92/68  Pulse: (!) 109   Resp: 17   Temp: 97.9 F (36.6 C)   SpO2: 98%     General: Awake, no distress.  CV:  Good peripheral perfusion.  Resp:  Normal effort.  Abd:  No distention.  Other:  Pale appearing, ill-appearing, answering questions appropriately.  Mildly decreased sensation to touch to the right upper and lower extremities.  He is unable to move his left lower extremity.   ED Results /  Procedures / Treatments   Labs (all labs ordered are listed, but only abnormal results are displayed) Labs Reviewed  COMPREHENSIVE METABOLIC PANEL - Abnormal; Notable for the following components:      Result Value   Glucose, Bld 101 (*)    Total Protein 5.9 (*)    All other components within normal limits  CBG MONITORING, ED - Abnormal; Notable for the following components:   Glucose-Capillary 65 (*)    All other components within normal limits  CBG MONITORING, ED - Abnormal; Notable for the following components:   Glucose-Capillary 59 (*)    All other components within normal limits  ETHANOL  PROTIME-INR  APTT  CBC  DIFFERENTIAL     I ordered and reviewed the above labs they are notable for glucose wnl  EKG  ED ECG REPORT I, Pilar Jarvis, the attending physician, personally viewed and interpreted this ECG.   Date: 12/16/2022  EKG Time: 1517  Rate: 63  Rhythm: sinus  Axis: nl  Intervals:none  ST&T Change:  no stemi    RADIOLOGY I independently reviewed and interpreted CT scan of the head and see no obvious bleeding or midline shift I also reviewed radiologist's formal read.   PROCEDURES:  Critical Care performed: Yes, see critical care procedure note(s)  .Critical Care  Performed by: Pilar Jarvis, MD Authorized by: Pilar Jarvis, MD   Critical care provider statement:    Critical care time (minutes):  30   Critical care was time spent personally by me on the following activities:  Development of treatment plan with patient or surrogate, discussions with consultants, evaluation of patient's response to treatment, examination of patient, ordering and review of laboratory studies, ordering and review of radiographic studies, ordering and performing treatments and interventions, pulse oximetry, re-evaluation of patient's condition and review of old charts    MEDICATIONS ORDERED IN ED: Medications  sodium chloride 0.9 % bolus 1,000 mL (1,000 mLs Intravenous New  Bag/Given 12/16/22 1445)  iohexol (OMNIPAQUE) 350 MG/ML injection 75 mL (75 mLs Intravenous Contrast Given 12/16/22 1420)    External physician / consultants:  I spoke with Dr. Otelia Limes of neurology regarding care plan for this patient.   IMPRESSION / MDM / ASSESSMENT AND PLAN / ED COURSE  I reviewed the triage vital signs and the nursing notes.                                Patient's presentation is most consistent with acute presentation with potential threat to life or bodily function.  Differential diagnosis includes, but is not limited to, CVA, dysrhythmia, electrolyte disturbance, hypoglycemia, dehydration, orthostatic hypotension, vasovagal syncope   The patient is on the cardiac monitor to evaluate for evidence of arrhythmia and/or significant heart rate changes.  MDM:    This is a patient with acute neurologic deficits including sensory deficits of the left side and left leg paralysis acute onset last known normal within 2 hours of arrival to the emergency department so stroke code was called.  Neurology at bedside for evaluation.  Will give fluids in the setting of likely dehydration from recent plasma donation, exacerbated by running to catch the bus.  Multiple syncopal episodes, obtain EKG, labs, electrolytes as well. -- Unremarkable workup thus far.  Patient now back to baseline color improved after fluids, CBG was 65, took p.o., feeling better ambulating no focal neurologic deficits noted.  Perhaps a psychogenic component.  Patient not giving urine sample.  Spoke with Dr. Otelia Limes of neurology, given unremarkable workup, plan will be for discharge given symptoms resolved.      Blood sugar was slightly low.  He was given apple juice, offered extra food and a recheck of his blood sugar but patient refused stating that he feels better and is about to walk across the street to go to a restaurant to eat a delicious meal at the seafood restaurant.   FINAL CLINICAL IMPRESSION(S)  / ED DIAGNOSES   Final diagnoses:  Near syncope  Left leg weakness     Rx / DC Orders   ED Discharge Orders          Ordered    Ambulatory Referral to Primary Care (Establish Care)        12/16/22 1723             Note:  This document was prepared using Dragon voice recognition software and may include unintentional dictation errors.     Pilar Jarvis, MD 12/16/22 [phone]  Pilar Jarvis, MD 12/16/22 [phone]

## 2022-12-16 NOTE — Progress Notes (Signed)
1346 Code stroke activated in CT. Reported LKW 1245. Pt had completed donating plasma.Reports syncopal episode x6, left arm and leg weakness with numbness/tingling.MRS 0. 1350 Neurology paged to notify of code stroke. 1400 Second page sent to neurology. 1403 Dr. Otelia Limes arrived to pt's room in ED to start code stroke evaluation. 1416 Pt left ED room for CTA to be completed. 1418 Pt returned to CT dept. 1428 CTA completed. Pt leaving CT department for STAT MRI.

## 2023-02-05 ENCOUNTER — Emergency Department
Admission: EM | Admit: 2023-02-05 | Discharge: 2023-02-05 | Disposition: A | Discharge status: Home / Self Care | Payer: MEDICAID | Attending: Emergency Medicine | Admitting: Emergency Medicine

## 2023-02-05 ENCOUNTER — Other Ambulatory Visit: Payer: Self-pay

## 2023-02-05 DIAGNOSIS — Z20822 Contact with and (suspected) exposure to covid-19: Secondary | ICD-10-CM | POA: Diagnosis not present

## 2023-02-05 DIAGNOSIS — J101 Influenza due to other identified influenza virus with other respiratory manifestations: Secondary | ICD-10-CM | POA: Insufficient documentation

## 2023-02-05 DIAGNOSIS — R059 Cough, unspecified: Secondary | ICD-10-CM | POA: Diagnosis present

## 2023-02-05 LAB — RESP PANEL BY RT-PCR (RSV, FLU A&B, COVID)  RVPGX2
Influenza A by PCR: POSITIVE — AB
Influenza B by PCR: NEGATIVE
Resp Syncytial Virus by PCR: NEGATIVE
SARS Coronavirus 2 by RT PCR: NEGATIVE

## 2023-02-05 MED ORDER — BENZONATATE 200 MG PO CAPS: 200.0000 mg | ORAL_CAPSULE | Freq: Three times a day (TID) | ORAL | 0 refills | Status: AC | PRN

## 2023-02-05 NOTE — Discharge Instructions (Addendum)
Follow-up with your primary care provider or urgent care if any continued problems.  A prescription for Tessalon Perles was sent to the pharmacy to help with coughing.  This is to be taken 1 every 8 hours and does not cause drowsiness.  Tylenol as needed for fever, body aches or headache.  Increase fluids to stay hydrated.  Return to the emergency department if any severe worsening of your symptoms such as difficulty breathing.

## 2023-02-05 NOTE — ED Triage Notes (Signed)
Pt states that he has had cough, congestion, body aches and headache x 2 days.

## 2023-02-05 NOTE — ED Provider Notes (Signed)
[facility] Provider Note    Event Date/Time   First MD Initiated Contact with Patient 02/05/23 [phone]     (approximate)   History   Cough   HPI  Ryan Odom is a 23 y.o. male   presents to the ED with complaint of cough, congestion, body aches and headache for the last 2 days.  Patient states that cough has been bad and he does not have any over-the-counter medication for this.  Patient denies being a smoker.  Positive history of WPW syndrome, PTSD, asthma, depression, depression.      Physical Exam   Triage Vital Signs: ED Triage Vitals  Encounter Vitals Group     BP 02/05/23 0445 107/71     Systolic BP Percentile --      Diastolic BP Percentile --      Pulse Rate 02/05/23 0445 (!) 115     Resp 02/05/23 0445 18     Temp 02/05/23 0445 99.3 F (37.4 C)     Temp Source 02/05/23 0445 Oral     SpO2 02/05/23 0445 98 %     Weight 02/05/23 0446 221 lb (100.2 kg)     Height 02/05/23 0446 5\' 10"  (1.778 m)     Head Circumference --      Peak Flow --      Pain Score 02/05/23 0446 7     Pain Loc --      Pain Education --      Exclude from Growth Chart --     Most recent vital signs: Vitals:   02/05/23 0445  BP: 107/71  Pulse: (!) 115  Resp: 18  Temp: 99.3 F (37.4 C)  SpO2: 98%     General: Awake, no distress.  CV:  Good peripheral perfusion.  Heart rate and rate rhythm. Resp:  Normal effort.  Lungs are clear bilaterally. Abd:  No distention.  Other:     ED Results / Procedures / Treatments   Labs (all labs ordered are listed, but only abnormal results are displayed) Labs Reviewed  RESP PANEL BY RT-PCR (RSV, FLU A&B, COVID)  RVPGX2 - Abnormal; Notable for the following components:      Result Value   Influenza A by PCR POSITIVE (*)    All other components within normal limits      PROCEDURES:  Critical Care performed:   Procedures   MEDICATIONS ORDERED IN ED: Medications - No data to display   IMPRESSION / MDM /  ASSESSMENT AND PLAN / ED COURSE  I reviewed the triage vital signs and the nursing notes.   Differential diagnosis includes, but is not limited to, COVID, influenza, RSV, viral illness.  23 year old male presents to the ED with cough, congestion, body aches and headache for the last 2 days.  Patient was made aware that his respiratory panel was positive for influenza A.  He is encouraged to continue with Tylenol/ibuprofen and increase fluids.  A prescription for Tessalon Perles was sent to the pharmacy for him to begin taking as needed for cough.  He has to follow-up with his PCP or urgent care if any continued problems.      Patient's presentation is most consistent with acute complicated illness / injury requiring diagnostic workup.  FINAL CLINICAL IMPRESSION(S) / ED DIAGNOSES   Final diagnoses:  Influenza A     Rx / DC Orders   ED Discharge Orders          Ordered  benzonatate (TESSALON) 200 MG capsule  3 times daily PRN        02/05/23 4742             Note:  This document was prepared using Dragon voice recognition software and may include unintentional dictation errors.   Tommi Rumps, PA-C 02/05/23 0831    Minna Antis, MD 02/05/23 [phone]

## 2023-03-02 ENCOUNTER — Ambulatory Visit: Admission: EM | Admit: 2023-03-02 | Discharge: 2023-03-02 | Discharge status: Against Medical Advice | Payer: MEDICAID

## 2023-03-02 NOTE — ED Notes (Addendum)
Called pt from # on file. Pt LWBS before triage. Stated no longer on premises.

## 2023-07-25 ENCOUNTER — Emergency Department
Admission: EM | Admit: 2023-07-25 | Discharge: 2023-07-25 | Disposition: A | Discharge status: Home / Self Care | Payer: MEDICAID | Attending: Emergency Medicine | Admitting: Emergency Medicine

## 2023-07-25 DIAGNOSIS — E860 Dehydration: Secondary | ICD-10-CM | POA: Diagnosis not present

## 2023-07-25 DIAGNOSIS — R252 Cramp and spasm: Secondary | ICD-10-CM | POA: Diagnosis present

## 2023-07-25 DIAGNOSIS — Z8679 Personal history of other diseases of the circulatory system: Secondary | ICD-10-CM | POA: Insufficient documentation

## 2023-07-25 LAB — CBC
HCT: 42.2 % (ref 39.0–52.0)
Hemoglobin: 14.6 g/dL (ref 13.0–17.0)
MCH: 29.1 pg (ref 26.0–34.0)
MCHC: 34.6 g/dL (ref 30.0–36.0)
MCV: 84.1 fL (ref 80.0–100.0)
Platelets: 262 K/uL (ref 150–400)
RBC: 5.02 MIL/uL (ref 4.22–5.81)
RDW: 12.3 % (ref 11.5–15.5)
WBC: 11 K/uL — ABNORMAL HIGH (ref 4.0–10.5)
nRBC: 0 % (ref 0.0–0.2)

## 2023-07-25 LAB — URINALYSIS, ROUTINE W REFLEX MICROSCOPIC
Bilirubin Urine: NEGATIVE
Glucose, UA: NEGATIVE mg/dL
Hgb urine dipstick: NEGATIVE
Ketones, ur: 5 mg/dL — AB
Leukocytes,Ua: NEGATIVE
Nitrite: NEGATIVE
Protein, ur: NEGATIVE mg/dL
Specific Gravity, Urine: 1.006 (ref 1.005–1.030)
pH: 5 (ref 5.0–8.0)

## 2023-07-25 LAB — COMPREHENSIVE METABOLIC PANEL WITH GFR
ALT: 30 U/L (ref 0–44)
AST: 48 U/L — ABNORMAL HIGH (ref 15–41)
Albumin: 5 g/dL (ref 3.5–5.0)
Alkaline Phosphatase: 92 U/L (ref 38–126)
Anion gap: 14 (ref 5–15)
BUN: 20 mg/dL (ref 6–20)
CO2: 23 mmol/L (ref 22–32)
Calcium: 10.9 mg/dL — ABNORMAL HIGH (ref 8.9–10.3)
Chloride: 96 mmol/L — ABNORMAL LOW (ref 98–111)
Creatinine, Ser: 1.37 mg/dL — ABNORMAL HIGH (ref 0.61–1.24)
GFR, Estimated: >60 mL/min (ref >60)
Glucose, Bld: 77 mg/dL (ref 70–99)
Potassium: 3.6 mmol/L (ref 3.5–5.1)
Sodium: 133 mmol/L — ABNORMAL LOW (ref 135–145)
Total Bilirubin: 1.4 mg/dL — ABNORMAL HIGH (ref 0.0–1.2)
Total Protein: 8 g/dL (ref 6.5–8.1)

## 2023-07-25 MED ORDER — LACTATED RINGERS IV BOLUS
1000.0000 mL | Freq: Once | INTRAVENOUS | Status: AC
  Administered 2023-07-25: 1000 mL via INTRAVENOUS

## 2023-07-25 NOTE — ED Triage Notes (Signed)
 First Nurse Note: Patient to ED via [facility] for weakness. PT was in a sauna after playing basketball. PT lethargic with EMS.   85 HR 126/51 Given 500 LR- 18 R AC

## 2023-07-25 NOTE — Discharge Instructions (Addendum)
 Please be sure to drink at least 2 L of of one-to-one mix of water and your choice of electrolyte solution before the end of the night.  He should also be drinking approximately 64 ounces of this one-to-one mix throughout the day every day.  For muscle repair and growth given the amount of exercise you are doing, I would suggest adding creatinine to your diet at 3 g daily.  The recommendation for protein intake per day is 1 g per pound of body weight however this may be more than your body needs.  If you need to decrease this amount due to increased flatulence, do not hesitate

## 2023-07-25 NOTE — ED Triage Notes (Signed)
 Pt presents to the ED via [facility] from the gym. Pt was playing basketball and had a near syncopal episode in the gym. Pt poor historian of episode

## 2023-07-25 NOTE — ED Provider Notes (Signed)
 [facility] Provider Note   Event Date/Time   First MD Initiated Contact with Patient 07/25/23 1823     (approximate) History  Near Syncope  HPI Ryan Odom is a 23 y.o. male with a stated past medical history of Wolff-Parkinson-White syndrome status post cryoablation who presents complaining of generalized muscle cramping, lightheadedness, and orthostatic presyncopal symptoms.  Patient states that he has decreased p.o. intake throughout the day as well as close to a gallon of water.  Patient states that he walks 2 miles to and from the gym daily for workouts and after walking to the gym today started his workout before having severe cramping in the bilateral lower extremities that has extended to the bilateral upper extremities as well. ROS: Patient currently denies any vision changes, tinnitus, difficulty speaking, facial droop, sore throat, chest pain, shortness of breath, abdominal pain, nausea/vomiting/diarrhea, dysuria, or weakness/numbness/paresthesias in any extremity   Physical Exam  Triage Vital Signs: ED Triage Vitals [07/25/23 1800]  Encounter Vitals Group     BP 127/79     Girls Systolic BP Percentile      Girls Diastolic BP Percentile      Boys Systolic BP Percentile      Boys Diastolic BP Percentile      Pulse Rate 94     Resp 18     Temp 98 F (36.7 C)     Temp Source Oral     SpO2 99 %     Weight      Height 5' 10 (1.778 m)     Head Circumference      Peak Flow      Pain Score 0     Pain Loc      Pain Education      Exclude from Growth Chart    Most recent vital signs: Vitals:   07/25/23 1800  BP: 127/79  Pulse: 94  Resp: 18  Temp: 98 F (36.7 C)  SpO2: 99%   General: Awake, oriented x4. CV:  Good peripheral perfusion. Resp:  Normal effort. Abd:  No distention. Other:  Young adult overweight Caucasian male resting comfortably in no acute distress ED Results / Procedures / Treatments  Labs (all labs ordered are listed,  but only abnormal results are displayed) Labs Reviewed  COMPREHENSIVE METABOLIC PANEL WITH GFR - Abnormal; Notable for the following components:      Result Value   Sodium 133 (*)    Chloride 96 (*)    Creatinine, Ser 1.37 (*)    Calcium 10.9 (*)    AST 48 (*)    Total Bilirubin 1.4 (*)    All other components within normal limits  CBC - Abnormal; Notable for the following components:   WBC 11.0 (*)    All other components within normal limits  URINALYSIS, ROUTINE W REFLEX MICROSCOPIC  PROCEDURES: Critical Care performed: No Procedures MEDICATIONS ORDERED IN ED: Medications  lactated ringers  bolus 1,000 mL (1,000 mLs Intravenous New Bag/Given 07/25/23 1905)   IMPRESSION / MDM / ASSESSMENT AND PLAN / ED COURSE  I reviewed the triage vital signs and the nursing notes.                             The patient is on the cardiac monitor to evaluate for evidence of arrhythmia and/or significant heart rate changes. Patient's presentation is most consistent with acute presentation with potential threat to life or bodily function.  This patient presents with generalized weakness and fatigue likely secondary to dehydration. Suspect acute kidney injury of prerenal origin. Doubt intrinsic renal dysfunction or obstructive nephropathy. Considered alternate etiologies of the patient's symptoms including infectious processes, severe metabolic derangements or electrolyte abnormalities, ischemia/ACS, heart failure, and intracranial/central processes but think these are unlikely given the history and physical exam.  Plan: labs, 1L IV fluid resuscitation, pain/nausea control, reassessment  Dispo: Discharge home with PCP follow-up   FINAL CLINICAL IMPRESSION(S) / ED DIAGNOSES   Final diagnoses:  Dehydration  Muscle cramping  History of Wolff-Parkinson-White (WPW) syndrome   Rx / DC Orders   ED Discharge Orders          Ordered    Ambulatory referral to Cardiology       Comments: If you have not  heard from the Cardiology office within the next 72 hours please call [phone].   07/25/23 1923           Note:  This document was prepared using Dragon voice recognition software and may include unintentional dictation errors.   Jossie Artist POUR, MD 07/25/23 [phone]

## 2023-08-18 ENCOUNTER — Telehealth: Payer: Self-pay

## 2023-08-18 NOTE — Telephone Encounter (Unsigned)
 This encounter was created in error - please disregard.

## 2024-02-09 ENCOUNTER — Emergency Department
Admission: EM | Admit: 2024-02-09 | Discharge: 2024-02-09 | Disposition: A | Discharge status: Home / Self Care | Payer: MEDICAID | Attending: Emergency Medicine | Admitting: Emergency Medicine

## 2024-02-09 ENCOUNTER — Other Ambulatory Visit: Payer: Self-pay

## 2024-02-09 ENCOUNTER — Emergency Department: Payer: MEDICAID

## 2024-02-09 DIAGNOSIS — R0602 Shortness of breath: Secondary | ICD-10-CM | POA: Insufficient documentation

## 2024-02-09 DIAGNOSIS — E860 Dehydration: Secondary | ICD-10-CM

## 2024-02-09 DIAGNOSIS — R42 Dizziness and giddiness: Secondary | ICD-10-CM | POA: Insufficient documentation

## 2024-02-09 DIAGNOSIS — R531 Weakness: Secondary | ICD-10-CM | POA: Diagnosis not present

## 2024-02-09 DIAGNOSIS — R55 Syncope and collapse: Secondary | ICD-10-CM | POA: Insufficient documentation

## 2024-02-09 LAB — COMPREHENSIVE METABOLIC PANEL WITH GFR
ALT: 34 U/L (ref 0–44)
AST: 30 U/L (ref 15–41)
Albumin: 4.6 g/dL (ref 3.5–5.0)
Alkaline Phosphatase: 100 U/L (ref 38–126)
Anion gap: 15 (ref 5–15)
BUN: 19 mg/dL (ref 6–20)
CO2: 25 mmol/L (ref 22–32)
Calcium: 10 mg/dL (ref 8.9–10.3)
Chloride: 101 mmol/L (ref 98–111)
Creatinine, Ser: 0.75 mg/dL (ref 0.61–1.24)
GFR, Estimated: >60 mL/min
Glucose, Bld: 78 mg/dL (ref 70–99)
Potassium: 3.7 mmol/L (ref 3.5–5.1)
Sodium: 140 mmol/L (ref 135–145)
Total Bilirubin: 0.5 mg/dL (ref 0.0–1.2)
Total Protein: 7.3 g/dL (ref 6.5–8.1)

## 2024-02-09 LAB — PRO BRAIN NATRIURETIC PEPTIDE: Pro Brain Natriuretic Peptide: <50 pg/mL

## 2024-02-09 LAB — CBC
HCT: 43.4 % (ref 39.0–52.0)
Hemoglobin: 14.9 g/dL (ref 13.0–17.0)
MCH: 29.4 pg (ref 26.0–34.0)
MCHC: 34.3 g/dL (ref 30.0–36.0)
MCV: 85.8 fL (ref 80.0–100.0)
Platelets: 175 K/uL (ref 150–400)
RBC: 5.06 MIL/uL (ref 4.22–5.81)
RDW: 11.8 % (ref 11.5–15.5)
WBC: 8.4 K/uL (ref 4.0–10.5)
nRBC: 0 % (ref 0.0–0.2)

## 2024-02-09 LAB — TROPONIN T, HIGH SENSITIVITY: Troponin T High Sensitivity: <6 ng/L (ref 0–19)

## 2024-02-09 NOTE — ED Provider Notes (Signed)
 "  [facility] Provider Note   Event Date/Time   First MD Initiated Contact with Patient 02/09/24 1352     (approximate) History  Loss of Consciousness (sob) and Shortness of Breath  HPI Ryan Odom is a 24 y.o. male with a stated past medical history of WPW status post ablation who presents complaining of generalized weakness and an episode of syncope.  Patient states that throughout this morning he had been feeling shortness of breath as well as mild lightheadedness that was worse with exertion orthostasis.  Patient also endorses seeing black spots in his vision.  Patient states that he was recently diagnosed with flu and pneumonia last week and feels that he is still getting over this illness.  Patient denies any subsequent episodes of loss of consciousness ROS: Patient currently denies any vision changes, tinnitus, difficulty speaking, facial droop, sore throat, chest pain, shortness of breath, abdominal pain, nausea/vomiting/diarrhea, dysuria, or numbness/paresthesias in any extremity   Physical Exam  Triage Vital Signs: ED Triage Vitals  Encounter Vitals Group     BP 02/09/24 1343 123/74     Girls Systolic BP Percentile --      Girls Diastolic BP Percentile --      Boys Systolic BP Percentile --      Boys Diastolic BP Percentile --      Pulse Rate 02/09/24 1337 69     Resp 02/09/24 1337 12     Temp 02/09/24 1337 98.1 F (36.7 C)     Temp Source 02/09/24 1337 Oral     SpO2 02/09/24 1337 100 %     Weight 02/09/24 1339 232 lb 12.9 oz (105.6 kg)     Height 02/09/24 1339 5' 8 (1.727 m)     Head Circumference --      Peak Flow --      Pain Score 02/09/24 1338 4     Pain Loc --      Pain Education --      Exclude from Growth Chart --    Most recent vital signs: Vitals:   02/09/24 1337 02/09/24 1343  BP:  123/74  Pulse: 69   Resp: 12   Temp: 98.1 F (36.7 C)   SpO2: 100%    General: Awake, oriented x4. CV:  Good peripheral  perfusion. Resp:  Normal effort. Abd:  No distention. Other:  Young adult obese Caucasian male resting comfortably in no acute distress ED Results / Procedures / Treatments  Labs (all labs ordered are listed, but only abnormal results are displayed) Labs Reviewed  CBC  PRO BRAIN NATRIURETIC PEPTIDE  COMPREHENSIVE METABOLIC PANEL WITH GFR  TROPONIN T, HIGH SENSITIVITY   EKG ED ECG REPORT I, Artist MARLA Kerns, the attending physician, personally viewed and interpreted this ECG. Date: 02/09/2024 EKG Time: 1334 Rate: 79 Rhythm: normal sinus rhythm QRS Axis: normal Intervals: normal ST/T Wave abnormalities: normal Narrative Interpretation: no evidence of acute ischemia PROCEDURES: Critical Care performed: No Procedures MEDICATIONS ORDERED IN ED: Medications - No data to display IMPRESSION / MDM / ASSESSMENT AND PLAN / ED COURSE  I reviewed the triage vital signs and the nursing notes.                             The patient is on the cardiac monitor to evaluate for evidence of arrhythmia and/or significant heart rate changes. Patient's presentation is most consistent with acute presentation with potential threat to life  or bodily function. Patient is a 24 year old male with the above-stated past medical history who presents complaining of lightheadedness and an episode of syncope just prior to arrival DDx: Orthostatic syncope, vasovagal syncope, hypoxia, hypocarbia, hypoglycemia Plan: CBC, CMP, BNP, troponin, chest x-ray, EKG  Care of this patient will be signed out to the oncoming physician at the end of my shift.  All pertinent patient information conveyed and all questions answered.  All further care and disposition decisions will be made by the oncoming physician.   FINAL CLINICAL IMPRESSION(S) / ED DIAGNOSES   Final diagnoses:  Syncope and collapse   Rx / DC Orders   ED Discharge Orders     None      Note:  This document was prepared using Dragon voice recognition  software and may include unintentional dictation errors.   Aeron Lheureux K, MD 02/09/24 [phone]  "

## 2024-02-09 NOTE — ED Triage Notes (Signed)
 Pt BIB [facility] from work. Pt had a syncopal episode upon transfer from work truck to doctor, general practice. 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 Recently dx with flu and pneumonia. Endorses spotty vision. AXO   140/76 95 HR 100% RA 87 CBG

## 2024-02-09 NOTE — ED Notes (Signed)
 2 RN's attempted to obtain blood. LAB CALLED
